# Patient Record
Sex: Male | Born: 1945 | Race: Black or African American | Hispanic: No | Marital: Married | Smoking: Former smoker
Health system: Southern US, Community
[De-identification: ages and names within clinical notes are randomized; demographics above are authoritative.]

## PROBLEM LIST (undated history)

## (undated) DIAGNOSIS — I10 Essential (primary) hypertension: Secondary | ICD-10-CM

## (undated) DIAGNOSIS — E119 Type 2 diabetes mellitus without complications: Secondary | ICD-10-CM

---

## 1998-01-27 ENCOUNTER — Encounter: Admission: RE | Admit: 1998-01-27 | Discharge: 1998-01-27 | Payer: Self-pay | Admitting: Family Medicine

## 1998-02-28 ENCOUNTER — Encounter: Admission: RE | Admit: 1998-02-28 | Discharge: 1998-02-28 | Payer: Self-pay | Admitting: Family Medicine

## 1998-03-10 ENCOUNTER — Encounter: Admission: RE | Admit: 1998-03-10 | Discharge: 1998-03-10 | Payer: Self-pay | Admitting: Family Medicine

## 1998-03-29 ENCOUNTER — Encounter: Admission: RE | Admit: 1998-03-29 | Discharge: 1998-03-29 | Payer: Self-pay | Admitting: Sports Medicine

## 1998-06-10 ENCOUNTER — Emergency Department (HOSPITAL_COMMUNITY): Admission: EM | Admit: 1998-06-10 | Discharge: 1998-06-10 | Payer: Self-pay | Admitting: Emergency Medicine

## 2007-07-10 ENCOUNTER — Emergency Department (HOSPITAL_COMMUNITY): Admission: EM | Admit: 2007-07-10 | Discharge: 2007-07-10 | Payer: Self-pay | Admitting: Emergency Medicine

## 2008-08-15 ENCOUNTER — Emergency Department (HOSPITAL_COMMUNITY): Admission: EM | Admit: 2008-08-15 | Discharge: 2008-08-15 | Payer: Self-pay | Admitting: Family Medicine

## 2010-09-14 LAB — PSA: PSA: 1.75 ng/mL (ref 0.10–4.00)

## 2010-09-14 LAB — POCT URINALYSIS DIP (DEVICE)
Bilirubin Urine: NEGATIVE
Glucose, UA: 500 mg/dL — AB
Hgb urine dipstick: NEGATIVE
Ketones, ur: NEGATIVE mg/dL
Specific Gravity, Urine: 1.02 (ref 1.005–1.030)

## 2010-11-02 IMAGING — CR DG LUMBAR SPINE 2-3V
2 series · 2 of 2 positions shown · non-contrast
Comparison: None.

CLINICAL DATA: Low back pain.  No recent injuries.

LUMBAR SPINE - 2-3 VIEW 08/15/2008:

[view not recorded (1 of 2)]
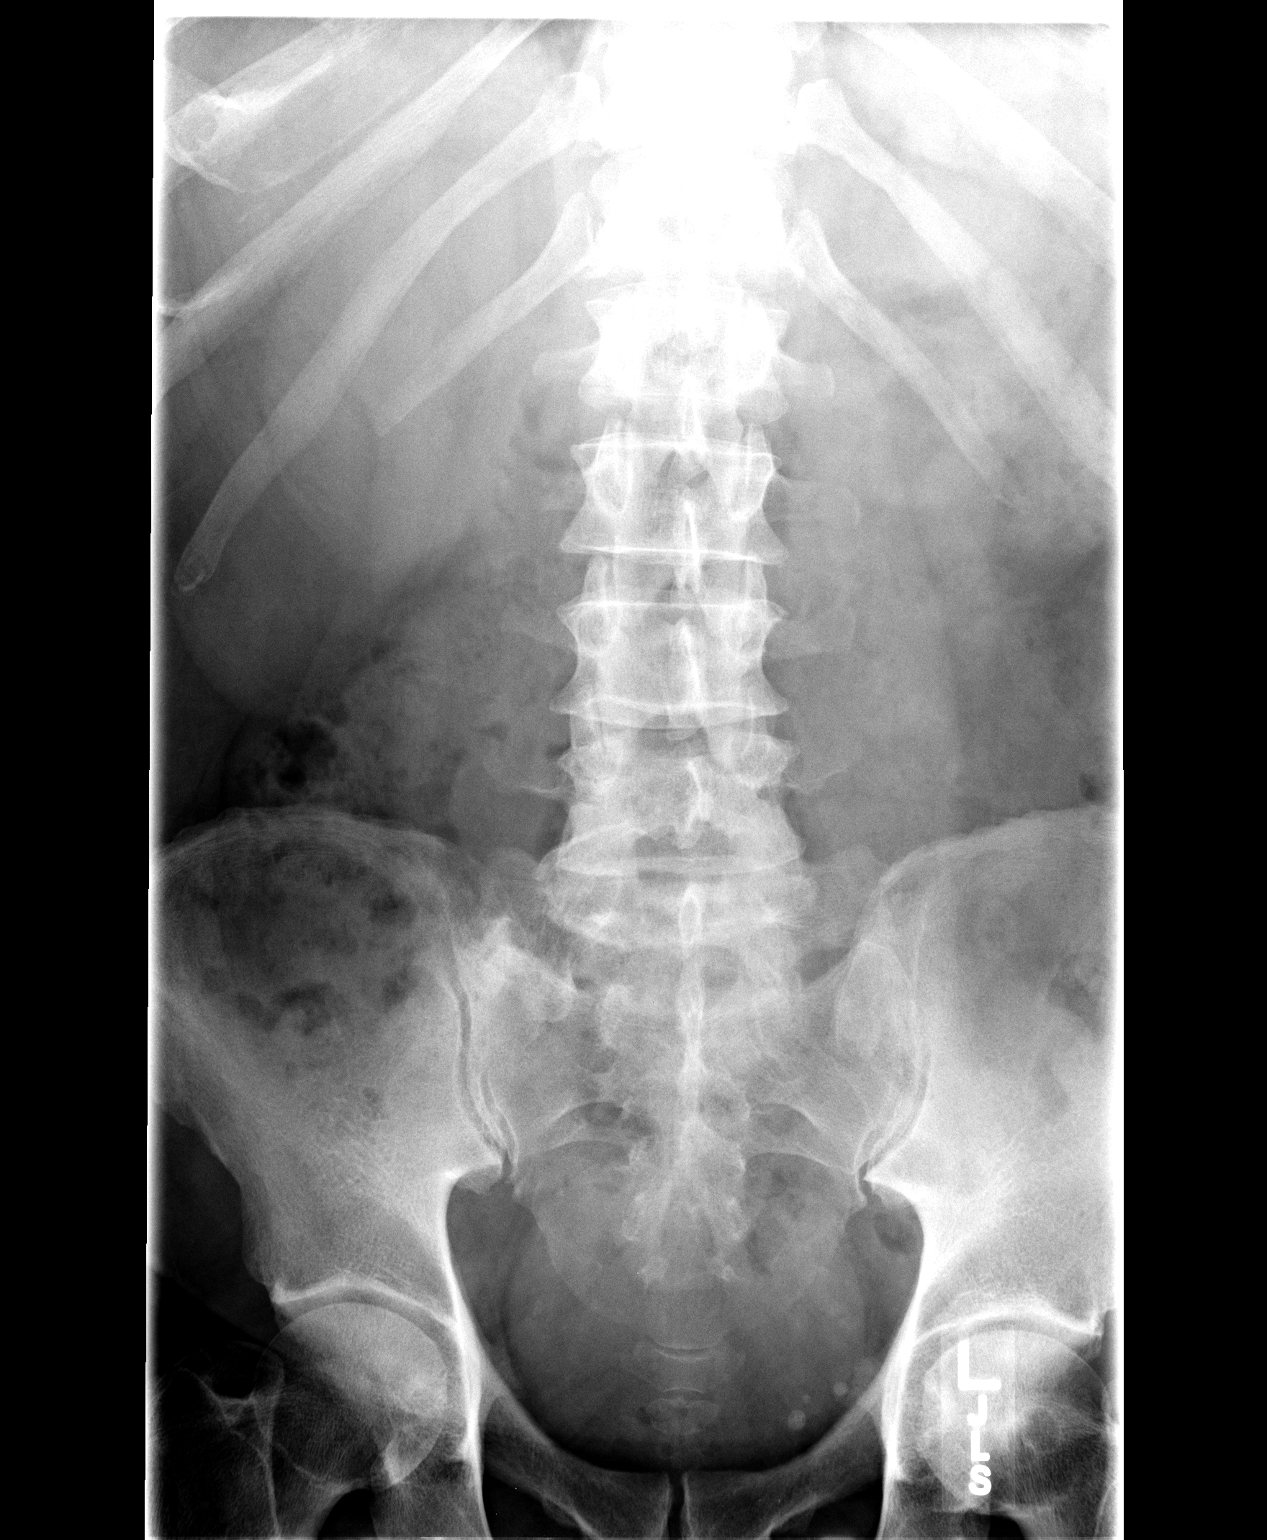

[view not recorded (2 of 2)]
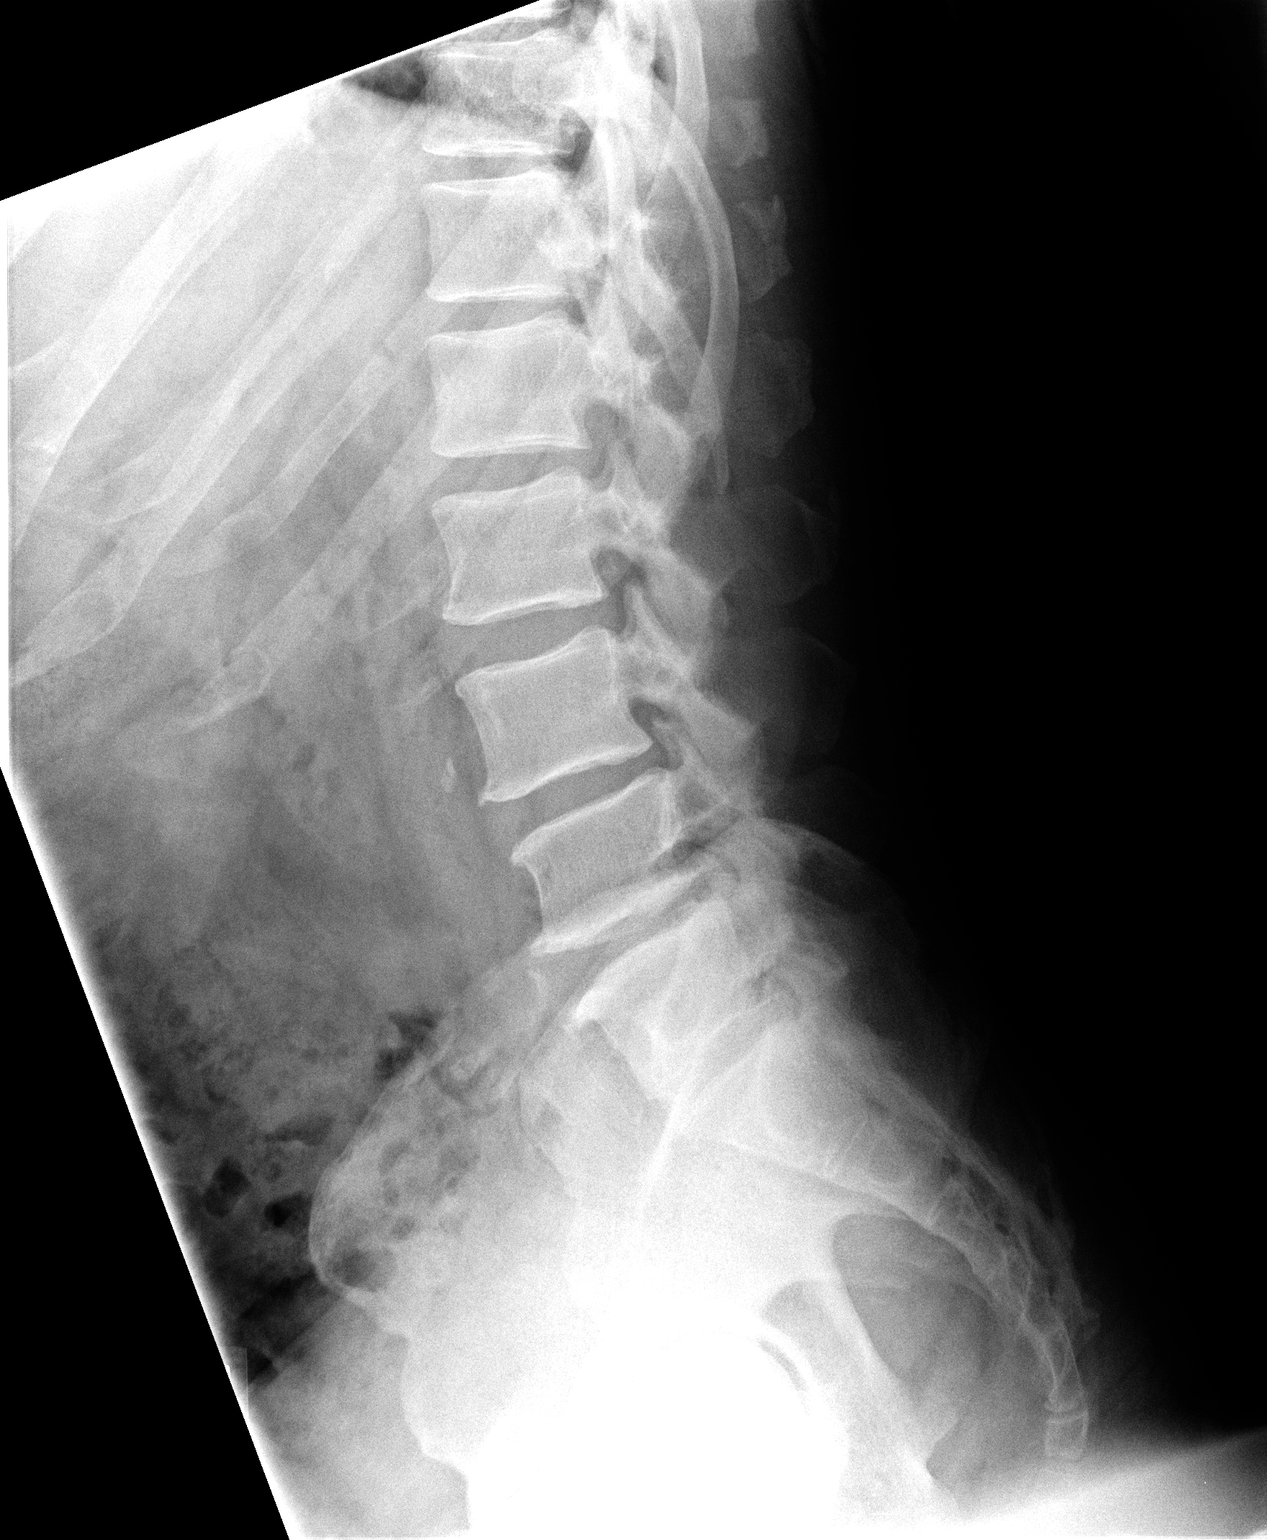

[2 of 2 positions shown; findings below may reference images not displayed]

FINDINGS: Five non-rib bearing lumbar vertebra with anatomic
alignment.  L5 is a transitional lumbosacral segment with well
defined assimilation joints between its transverse processes and
the first sacral segment.  Degenerative changes are present in the
right assimilation joint.  Disc space narrowing and associated
endplate hypertrophic changes at L4-5 and to a lesser degree L3-4
and L5-S1.  Visualized sacroiliac joints intact.
IMPRESSION: 1.  Degenerative disc disease and spondylosis at L4-5 and to a
lesser degree L3-4 and L5-S1.
2.  Transitional L5 segment with assimilation joints between its
transverse processes and the first sacral segment.  Degenerative
changes in the right assimilation joint.

## 2011-02-23 LAB — INFLUENZA A AND B ANTIGEN (CONVERTED LAB)
Inflenza A Ag: NEGATIVE
Influenza B Ag: NEGATIVE

## 2014-07-29 ENCOUNTER — Encounter (HOSPITAL_COMMUNITY): Payer: Self-pay | Admitting: Emergency Medicine

## 2014-07-29 ENCOUNTER — Emergency Department (INDEPENDENT_AMBULATORY_CARE_PROVIDER_SITE_OTHER)
Admission: EM | Admit: 2014-07-29 | Discharge: 2014-07-29 | Disposition: A | Discharge status: Home / Self Care | Payer: PPO | Source: Home / Self Care | Attending: Family Medicine | Admitting: Family Medicine

## 2014-07-29 DIAGNOSIS — I10 Essential (primary) hypertension: Secondary | ICD-10-CM

## 2014-07-29 DIAGNOSIS — J0100 Acute maxillary sinusitis, unspecified: Secondary | ICD-10-CM

## 2014-07-29 HISTORY — DX: Type 2 diabetes mellitus without complications: E11.9

## 2014-07-29 HISTORY — DX: Essential (primary) hypertension: I10

## 2014-07-29 MED ORDER — IPRATROPIUM BROMIDE 0.06 % NA SOLN: 2.0000 | Freq: Four times a day (QID) | NASAL | Status: DC

## 2014-07-29 MED ORDER — AMOXICILLIN-POT CLAVULANATE 875-125 MG PO TABS: 1.0000 | ORAL_TABLET | Freq: Two times a day (BID) | ORAL | Status: DC

## 2014-07-29 NOTE — ED Notes (Signed)
Not in treatment room 

## 2014-07-29 NOTE — ED Provider Notes (Signed)
CSN: [id number]     Arrival date & time 07/29/14  1443 History   First MD Initiated Contact with Patient 07/29/14 1612     Chief Complaint  Patient presents with  . URI   (Consider location/radiation/quality/duration/timing/severity/associated sxs/prior Treatment) HPI  Cold for 3 weeks: runny nose, cough and sore throat. Feeling "warm" over the past week. Getting worse. Now w/ sinus congestion and nausea. OTC cough medicine w/o benefit. Symptoms worse at night. Symptoms are constant  HTN: did not take BP medicine this morning. Pt took cold medicine instead. Denies CP, SOB, palpitations.    Past Medical History  Diagnosis Date  . Hypertension   . Diabetes mellitus without complication    History reviewed. No pertinent past surgical history. Family History  Problem Relation Age of Onset  . Diabetes Mother   . Kidney failure Mother   . Heart attack Father   . Diabetes Maternal Grandmother    History  Substance Use Topics  . Smoking status: Former Games developermoker  . Smokeless tobacco: Not on file  . Alcohol Use: No    Review of Systems Per HPI with all other pertinent systems negative.   Allergies  Review of patient's allergies indicates no known allergies.  Home Medications   Prior to Admission medications   Medication Sig Start Date End Date Taking? Authorizing Provider  METFORMIN HCL PO Take by mouth.   Yes Historical Provider, MD  OVER THE COUNTER MEDICATION Blood pressure pills   Yes Historical Provider, MD  amoxicillin-clavulanate (AUGMENTIN) 875-125 MG per tablet Take 1 tablet by mouth 2 (two) times daily. 07/29/14   Ozella Rocksavid J Merrell, MD  ipratropium (ATROVENT) 0.06 % nasal spray Place 2 sprays into both nostrils 4 (four) times daily. 07/29/14   Ozella Rocksavid J Merrell, MD   BP 165/91 mmHg  Pulse 81  Temp(Src) 98.5 F (36.9 C) (Oral)  Resp 24  SpO2 97% Physical Exam  Constitutional: He is oriented to person, place, and time. He appears well-developed and well-nourished.   HENT:  Head: Normocephalic and atraumatic.  Maxillary sinuses tender to palpation. Pharyngeal cobblestoning tonsils 0-1+ without erythema or exudate  Eyes: EOM are normal. Pupils are equal, round, and reactive to light.  Neck: Normal range of motion. Neck supple.  Cardiovascular: Normal rate and normal heart sounds.   No murmur heard. Pulmonary/Chest: Effort normal and breath sounds normal. No respiratory distress. He has no wheezes. He has no rales. He exhibits no tenderness.  Abdominal: Soft. Bowel sounds are normal.  Musculoskeletal: Normal range of motion. He exhibits no edema or tenderness.  Neurological: He is alert and oriented to person, place, and time.  Skin: Skin is warm and dry.  Psychiatric: He has a normal mood and affect. His behavior is normal. Judgment and thought content normal.    ED Course  Procedures (including critical care time) Labs Review Labs Reviewed - No data to display  Imaging Review No results found.   MDM   1. Acute maxillary sinusitis, recurrence not specified   2. Essential hypertension     Symptoms ongoing for 3 weeks with acute worsening over the last few days. Start Augmentin. Nasal Atrovent for nasal congestion. Start Zyrtec or another over-the-counter allergy medicine. Patient encouraged to take blood pressure medications as prescribed.    Ozella Rocksavid J Merrell, MD 07/29/14 [phone]

## 2014-07-29 NOTE — ED Notes (Signed)
2 week history: cough that is worsening, productive cough, phlegm is yellow.  Reports fever, unknown how high. Denies chills, runny nose, head hurts/tight and chest sore:chest hurts with deep breathing or coughing

## 2014-07-29 NOTE — Discharge Instructions (Signed)
You likely have a sinus infection that will require antibiotics to clear. Please take these for the full 10 days. Please use the nasal atrovent for nasal congestion. Please use tylenol and  ibuprofen for relief.  Please take your blood pressure medication as prescribed.   Please come back if you do not get better.

## 2014-08-10 ENCOUNTER — Emergency Department (INDEPENDENT_AMBULATORY_CARE_PROVIDER_SITE_OTHER)
Admission: EM | Admit: 2014-08-10 | Discharge: 2014-08-10 | Disposition: A | Discharge status: Home / Self Care | Payer: PPO | Source: Home / Self Care | Attending: Family Medicine | Admitting: Family Medicine

## 2014-08-10 ENCOUNTER — Encounter (HOSPITAL_COMMUNITY): Payer: Self-pay | Admitting: Emergency Medicine

## 2014-08-10 DIAGNOSIS — N289 Disorder of kidney and ureter, unspecified: Secondary | ICD-10-CM

## 2014-08-10 DIAGNOSIS — T783XXA Angioneurotic edema, initial encounter: Secondary | ICD-10-CM

## 2014-08-10 DIAGNOSIS — I10 Essential (primary) hypertension: Secondary | ICD-10-CM

## 2014-08-10 LAB — POCT I-STAT, CHEM 8
BUN: 34 mg/dL — AB (ref 6–23)
CHLORIDE: 107 mmol/L (ref 96–112)
CREATININE: 1.5 mg/dL — AB (ref 0.50–1.35)
Calcium, Ion: 1.25 mmol/L (ref 1.13–1.30)
Glucose, Bld: 96 mg/dL (ref 70–99)
HCT: 50 % (ref 39.0–52.0)
Hemoglobin: 17 g/dL (ref 13.0–17.0)
POTASSIUM: 4.7 mmol/L (ref 3.5–5.1)
SODIUM: 141 mmol/L (ref 135–145)
TCO2: 21 mmol/L (ref 0–100)

## 2014-08-10 MED ORDER — METHYLPREDNISOLONE SODIUM SUCC 125 MG IJ SOLR
INTRAMUSCULAR | Status: AC
  Filled 2014-08-10: qty 2

## 2014-08-10 MED ORDER — METHYLPREDNISOLONE SODIUM SUCC 125 MG IJ SOLR
125.0000 mg | Freq: Once | INTRAMUSCULAR | Status: AC
  Administered 2014-08-10: 125 mg via INTRAMUSCULAR

## 2014-08-10 MED ORDER — DIPHENHYDRAMINE HCL 50 MG/ML IJ SOLN
INTRAMUSCULAR | Status: AC
  Filled 2014-08-10: qty 1

## 2014-08-10 MED ORDER — AMLODIPINE BESYLATE 5 MG PO TABS: 5.0000 mg | ORAL_TABLET | Freq: Every day | ORAL | Status: DC

## 2014-08-10 MED ORDER — FAMOTIDINE 20 MG PO TABS
20.0000 mg | ORAL_TABLET | Freq: Once | ORAL | Status: AC
  Administered 2014-08-10: 20 mg via ORAL

## 2014-08-10 MED ORDER — DIPHENHYDRAMINE HCL 50 MG/ML IJ SOLN
50.0000 mg | Freq: Once | INTRAMUSCULAR | Status: AC
  Administered 2014-08-10: 50 mg via INTRAMUSCULAR

## 2014-08-10 MED ORDER — FAMOTIDINE 20 MG PO TABS
ORAL_TABLET | ORAL | Status: AC
  Filled 2014-08-10: qty 1

## 2014-08-10 MED ORDER — PREDNISONE 50 MG PO TABS: ORAL_TABLET | ORAL | Status: DC

## 2014-08-10 NOTE — ED Provider Notes (Signed)
CSN: [id number]     Arrival date & time 08/10/14  1558 History   First MD Initiated Contact with Patient 08/10/14 1651     Chief Complaint  Patient presents with  . Facial Swelling   (Consider location/radiation/quality/duration/timing/severity/associated sxs/prior Treatment) HPI   facial swelling : Started this morning when patient awoke. Involving both orbits , lip( , in left cheek area. Patient denies any tongue swelling or dysphagia her dyspnea. Patient is not itchy and does not have a rash. Patient has not had any change in his medications recently. He takes lisinopril. No change in diet. No other change in home environment that may have caused his symptoms. Patient states that he prayed for significant amount of time  For relief and notes improvement in symptoms.  Symptoms are constant. Nonradiating.   acute sinusitis: diagnosed with acute sinusitis on 07/29/2014. Treated with antibiotics. Patient took the last dose of this yesterday or the day before an issue has completely resolved.    Past Medical History  Diagnosis Date  . Hypertension   . Diabetes mellitus without complication    History reviewed. No pertinent past surgical history. Family History  Problem Relation Age of Onset  . Diabetes Mother   . Kidney failure Mother   . Heart attack Father   . Diabetes Maternal Grandmother    History  Substance Use Topics  . Smoking status: Former Games developermoker  . Smokeless tobacco: Not on file  . Alcohol Use: No    Review of Systems Per HPI with all other pertinent systems negative.   Allergies  Review of patient's allergies indicates no known allergies.  Home Medications   Prior to Admission medications   Medication Sig Start Date End Date Taking? Authorizing Provider  LISINOPRIL PO Take by mouth.   Yes Historical Provider, MD  METFORMIN HCL PO Take by mouth.   Yes Historical Provider, MD  simvastatin (ZOCOR) 10 MG tablet Take 10 mg by mouth daily.   Yes Historical Provider,  MD  ipratropium (ATROVENT) 0.06 % nasal spray Place 2 sprays into both nostrils 4 (four) times daily. 07/29/14   Ozella Rocksavid J Scarlettrose Costilow, MD  OVER THE COUNTER MEDICATION Blood pressure pills    Historical Provider, MD   BP 148/88 mmHg  Pulse 90  Temp(Src) 98.1 F (36.7 C) (Oral)  Resp 16  SpO2 99% Physical Exam  Constitutional: He is oriented to person, place, and time. He appears well-developed and well-nourished.  HENT:   Bilateral periorbital swelling and lower lip edematous approximately 3-4 times normal. Tongue normal in caliber and articulation.  Eyes: EOM are normal. Pupils are equal, round, and reactive to light.  Neck: Normal range of motion.  Cardiovascular: Normal rate, normal heart sounds and intact distal pulses.   No murmur heard. Pulmonary/Chest: Effort normal and breath sounds normal. No stridor. No respiratory distress. He has no wheezes. He has no rales.  Abdominal: Soft. He exhibits no distension.  Lymphadenopathy:    He has no cervical adenopathy.  Neurological: He is alert and oriented to person, place, and time.  Skin: Skin is warm and dry.  Psychiatric: He has a normal mood and affect. His behavior is normal. Judgment and thought content normal.    ED Course  Procedures (including critical care time) Labs Review Labs Reviewed  POCT I-STAT, CHEM 8 - Abnormal; Notable for the following:    BUN 34 (*)    Creatinine, Ser 1.50 (*)    All other components within normal limits    Imaging  Review No results found.   MDM   1. Angioedema, initial encounter   2. Essential hypertension   3. Renal impairment    Chem 8 showing Cr 1.5 (AKI vs CKD - likely CKD). No previous labs to compare Stop lisinopril - Start Norvasc for BP control - f/u PCP in 1-2 weeks for further bp management Solumedrol  IM, Benadryl  IM, pepcid  po given. Pt stable and not needing further mgt in ED at this time Continue Steroids (prednisone ) x 6 days F/u PCP regarding renal  function  Strict precautions given     Ozella Rocks, MD 08/10/14 [phone]

## 2014-08-10 NOTE — Discharge Instructions (Signed)
Your symptoms are likely due to a severe reaction to the lisnopril. Please do not ever take this medicine again Please start norvasc in its place Please take the prednisone and use benadryl for the next 1-2 days Please follow up with your doctor in 1-2 weeks for further blood work and a blood pressure check Your kidney function was not normal and you need this to be checked.

## 2014-08-10 NOTE — ED Notes (Signed)
Reports swelling to lips and left side of face.   Pt states that after eating dinner last night he fell asleep and woke with facial swelling.  No new foods or change in diet.  Currently finished antibiotic RX Amoxicillin.  Denies any other changes or new medications.

## 2015-08-16 DIAGNOSIS — Z961 Presence of intraocular lens: Secondary | ICD-10-CM | POA: Diagnosis not present

## 2015-08-16 DIAGNOSIS — E119 Type 2 diabetes mellitus without complications: Secondary | ICD-10-CM | POA: Diagnosis not present

## 2015-08-16 DIAGNOSIS — I10 Essential (primary) hypertension: Secondary | ICD-10-CM | POA: Diagnosis not present

## 2015-09-07 DIAGNOSIS — I10 Essential (primary) hypertension: Secondary | ICD-10-CM | POA: Diagnosis not present

## 2015-09-07 DIAGNOSIS — G4700 Insomnia, unspecified: Secondary | ICD-10-CM | POA: Diagnosis not present

## 2015-09-07 DIAGNOSIS — E119 Type 2 diabetes mellitus without complications: Secondary | ICD-10-CM | POA: Diagnosis not present

## 2015-09-07 DIAGNOSIS — Z125 Encounter for screening for malignant neoplasm of prostate: Secondary | ICD-10-CM | POA: Diagnosis not present

## 2015-09-19 DIAGNOSIS — Z961 Presence of intraocular lens: Secondary | ICD-10-CM | POA: Diagnosis not present

## 2015-09-19 DIAGNOSIS — E119 Type 2 diabetes mellitus without complications: Secondary | ICD-10-CM | POA: Diagnosis not present

## 2016-08-14 DIAGNOSIS — E119 Type 2 diabetes mellitus without complications: Secondary | ICD-10-CM | POA: Diagnosis not present

## 2016-08-14 DIAGNOSIS — I10 Essential (primary) hypertension: Secondary | ICD-10-CM | POA: Diagnosis not present

## 2016-09-19 DIAGNOSIS — E119 Type 2 diabetes mellitus without complications: Secondary | ICD-10-CM | POA: Diagnosis not present

## 2016-09-19 DIAGNOSIS — H26492 Other secondary cataract, left eye: Secondary | ICD-10-CM | POA: Diagnosis not present

## 2016-09-19 DIAGNOSIS — H26491 Other secondary cataract, right eye: Secondary | ICD-10-CM | POA: Diagnosis not present

## 2018-03-24 DIAGNOSIS — H26493 Other secondary cataract, bilateral: Secondary | ICD-10-CM | POA: Diagnosis not present

## 2018-03-24 DIAGNOSIS — Z961 Presence of intraocular lens: Secondary | ICD-10-CM | POA: Diagnosis not present

## 2018-03-24 DIAGNOSIS — H26492 Other secondary cataract, left eye: Secondary | ICD-10-CM | POA: Diagnosis not present

## 2018-03-24 DIAGNOSIS — H18411 Arcus senilis, right eye: Secondary | ICD-10-CM | POA: Diagnosis not present

## 2018-03-24 DIAGNOSIS — E119 Type 2 diabetes mellitus without complications: Secondary | ICD-10-CM | POA: Diagnosis not present

## 2020-05-04 ENCOUNTER — Ambulatory Visit (HOSPITAL_COMMUNITY)
Admission: EM | Admit: 2020-05-04 | Discharge: 2020-05-04 | Disposition: A | Discharge status: Home / Self Care | Payer: Medicare Other | Attending: Family Medicine | Admitting: Family Medicine

## 2020-05-04 ENCOUNTER — Encounter (HOSPITAL_COMMUNITY): Payer: Self-pay

## 2020-05-04 ENCOUNTER — Other Ambulatory Visit: Payer: Self-pay

## 2020-05-04 ENCOUNTER — Ambulatory Visit (INDEPENDENT_AMBULATORY_CARE_PROVIDER_SITE_OTHER): Payer: Medicare Other

## 2020-05-04 DIAGNOSIS — J069 Acute upper respiratory infection, unspecified: Secondary | ICD-10-CM | POA: Insufficient documentation

## 2020-05-04 DIAGNOSIS — Z7952 Long term (current) use of systemic steroids: Secondary | ICD-10-CM | POA: Diagnosis not present

## 2020-05-04 DIAGNOSIS — R0602 Shortness of breath: Secondary | ICD-10-CM

## 2020-05-04 DIAGNOSIS — R059 Cough, unspecified: Secondary | ICD-10-CM | POA: Diagnosis not present

## 2020-05-04 DIAGNOSIS — Z87891 Personal history of nicotine dependence: Secondary | ICD-10-CM | POA: Diagnosis not present

## 2020-05-04 DIAGNOSIS — Z20822 Contact with and (suspected) exposure to covid-19: Secondary | ICD-10-CM | POA: Diagnosis not present

## 2020-05-04 LAB — RESP PANEL BY RT-PCR (FLU A&B, COVID) ARPGX2
Influenza A by PCR: NEGATIVE
Influenza B by PCR: NEGATIVE
SARS Coronavirus 2 by RT PCR: NEGATIVE

## 2020-05-04 MED ORDER — AZITHROMYCIN 250 MG PO TABS
ORAL_TABLET | ORAL | 0 refills | Status: AC
Start: 2020-05-04 — End: 2020-05-09

## 2020-05-04 NOTE — ED Provider Notes (Signed)
[facility]    CSN: [id number] Arrival date & time: 05/04/20  1311      History   Chief Complaint Chief Complaint  Patient presents with  . Cough  . Shortness of Breath  . Generalized Body Aches  . Fatigue    HPI Darrell Todd is a 74 y.o. male.   Darrell Todd presents with complaints of cough and shortness of breath. First noted feeling unwell 11/28, but worse on 11/29. Has felt like he could lose consciousness but has not. No known fevers or chills. Diarrhea. No nausea or vomiting. Nasal drainage. No sore throat or ear pain. No known ill contacts. Has had two covid-19 vaccinations, has not received a booster yet. Has been taking over the counter medications which have minimally helped. No history of copd or asthma. Quit smoking 30 years ago. History of htn and DM.    ROS per HPI, negative if not otherwise mentioned.      Past Medical History:  Diagnosis Date  . Diabetes mellitus without complication (HCC)   . Hypertension     There are no problems to display for this patient.   History reviewed. No pertinent surgical history.     Home Medications    Prior to Admission medications   Medication Sig Start Date End Date Taking? Authorizing Provider  amLODipine (NORVASC) 5 MG tablet Take 1 tablet (5 mg total) by mouth daily. 08/10/14   Ozella Rocks, MD  azithromycin (ZITHROMAX) 250 MG tablet Take 2 tablets (500 mg total) by mouth daily for 1 day, THEN 1 tablet (250 mg total) daily for 4 days. 05/04/20 05/09/20  Linus Mako B, NP  ipratropium (ATROVENT) 0.06 % nasal spray Place 2 sprays into both nostrils 4 (four) times daily. 07/29/14   Ozella Rocks, MD  METFORMIN HCL PO Take by mouth.    [provider]  OVER THE COUNTER MEDICATION Blood pressure pills    [provider]  predniSONE (DELTASONE) 50 MG tablet Take daily with breakfast 08/10/14   Ozella Rocks, MD  simvastatin (ZOCOR) 10 MG tablet Take 10 mg by mouth daily.     [provider]  LISINOPRIL PO Take by mouth.  08/10/14  [provider]    Family History Family History  Problem Relation Age of Onset  . Diabetes Mother   . Kidney failure Mother   . Heart attack Father   . Diabetes Maternal Grandmother     Social History Social History   Tobacco Use  . Smoking status: Former Games developer  . Smokeless tobacco: Never Used  Substance Use Topics  . Alcohol use: No  . Drug use: No     Allergies   Lisinopril   Review of Systems Review of Systems   Physical Exam Triage Vital Signs ED Triage Vitals  Enc Vitals Group     BP 05/04/20 1346 (!) 148/64     Pulse Rate 05/04/20 1346 86     Resp 05/04/20 1346 (!) 21     Temp 05/04/20 1346 97.9 F (36.6 C)     Temp Source 05/04/20 1345 Oral     SpO2 05/04/20 1346 99 %     Weight --      Height --      Head Circumference --      Peak Flow --      Pain Score 05/04/20 1343 4     Pain Loc --      Pain Edu? --  Excl. in GC? --    No data found.  Updated Vital Signs BP (!) 148/64 (BP Location: Right Arm)   Pulse 86   Temp 97.9 F (36.6 C) (Oral)   Resp (!) 21   SpO2 99%    Physical Exam Constitutional:      Appearance: He is well-developed.  HENT:     Mouth/Throat:     Mouth: Mucous membranes are moist.  Cardiovascular:     Rate and Rhythm: Normal rate.  Pulmonary:     Effort: Pulmonary effort is normal.     Breath sounds: Decreased breath sounds present.     Comments: Occasional cough noted  Skin:    General: Skin is warm and dry.  Neurological:     Mental Status: He is alert and oriented to person, place, and time.      UC Treatments / Results  Labs (all labs ordered are listed, but only abnormal results are displayed) Labs Reviewed  RESP PANEL BY RT-PCR (FLU A&B, COVID) [id number]    EKG   Radiology DG Chest 2 View  Result Date: 05/04/2020 CLINICAL DATA:  74 year old male with shortness of breath and cough. EXAM: CHEST - 2 VIEW COMPARISON:   None. FINDINGS: No focal consolidation, pleural effusion or pneumothorax. The cardiac silhouette is within limits. No acute osseous pathology. Indeterminate 12 mm nodular density in the soft tissues of the left lateral chest wall. IMPRESSION: No active cardiopulmonary disease. Electronically Signed   By: Elgie Collard M.D.   On: 05/04/2020 15:56    Procedures Procedures (including critical care time)  Medications Ordered in UC Medications - No data to display  Initial Impression / Assessment and Plan / UC Course  I have reviewed the triage vital signs and the nursing notes.  Pertinent labs & imaging results that were available during my care of the patient were reviewed by me and considered in my medical decision making (see chart for details).     Chest xray normal today which is reassuring. Mild tachypnea and description of significant shortness of breath over the past few day. Afebrile here today. Opted to provide empiric treatment with azithromycin at this time pending viral panel. Return precautions provided. Patient verbalized understanding and agreeable to plan.  Ambulatory out of clinic without difficulty.    Final Clinical Impressions(s) / UC Diagnoses   Final diagnoses:  Upper respiratory tract infection, unspecified type     Discharge Instructions     Your chest xray looks well today which is reassuring.  I have sent antibiotics I would like you to take in hopes that this is helpful.  If this is viral, antibiotics may not be beneficial, however.  Self isolate until covid results are back and negative.  Will notify you by phone of any positive findings. Your negative results will be sent through your MyChart.     Please return if symptoms persist. Please go to the ER if symptoms are worsening.      ED Prescriptions    Medication Sig Dispense Auth. Provider   azithromycin (ZITHROMAX) 250 MG tablet Take 2 tablets (500 mg total) by mouth daily for 1 day, THEN 1 tablet  (250 mg total) daily for 4 days. 6 tablet Georgetta Haber, NP     PDMP not reviewed this encounter.   Georgetta Haber, NP 05/04/20 [phone]

## 2020-05-04 NOTE — ED Triage Notes (Addendum)
Pt in with c/o body aches, productive cough, fatigue and SOB that started Sunday.also c/o feeling light headed  Pt states he has tired to take cold medicine but has had no relief  Denies fever, N/V, diarrhea

## 2020-05-04 NOTE — Discharge Instructions (Addendum)
Your chest xray looks well today which is reassuring.  I have sent antibiotics I would like you to take in hopes that this is helpful.  If this is viral, antibiotics may not be beneficial, however.  Self isolate until covid results are back and negative.  Will notify you by phone of any positive findings. Your negative results will be sent through your MyChart.     Please return if symptoms persist. Please go to the ER if symptoms are worsening.

## 2020-06-04 ENCOUNTER — Emergency Department (HOSPITAL_COMMUNITY)
Admission: EM | Admit: 2020-06-04 | Discharge: 2020-06-04 | Disposition: A | Discharge status: Home / Self Care | Payer: Medicare Other | Attending: Emergency Medicine | Admitting: Emergency Medicine

## 2020-06-04 ENCOUNTER — Other Ambulatory Visit: Payer: Self-pay

## 2020-06-04 ENCOUNTER — Encounter (HOSPITAL_COMMUNITY): Payer: Self-pay | Admitting: Emergency Medicine

## 2020-06-04 DIAGNOSIS — I10 Essential (primary) hypertension: Secondary | ICD-10-CM | POA: Diagnosis not present

## 2020-06-04 DIAGNOSIS — Z79899 Other long term (current) drug therapy: Secondary | ICD-10-CM | POA: Insufficient documentation

## 2020-06-04 DIAGNOSIS — Z7984 Long term (current) use of oral hypoglycemic drugs: Secondary | ICD-10-CM | POA: Insufficient documentation

## 2020-06-04 DIAGNOSIS — Z87891 Personal history of nicotine dependence: Secondary | ICD-10-CM | POA: Diagnosis not present

## 2020-06-04 DIAGNOSIS — R739 Hyperglycemia, unspecified: Secondary | ICD-10-CM | POA: Diagnosis present

## 2020-06-04 DIAGNOSIS — E1165 Type 2 diabetes mellitus with hyperglycemia: Secondary | ICD-10-CM | POA: Insufficient documentation

## 2020-06-04 DIAGNOSIS — Z7952 Long term (current) use of systemic steroids: Secondary | ICD-10-CM | POA: Insufficient documentation

## 2020-06-04 LAB — BASIC METABOLIC PANEL
Anion gap: 7 (ref 5–15)
BUN: 21 mg/dL (ref 8–23)
CO2: 28 mmol/L (ref 22–32)
Calcium: 9.5 mg/dL (ref 8.9–10.3)
Chloride: 98 mmol/L (ref 98–111)
Creatinine, Ser: 1.34 mg/dL — ABNORMAL HIGH (ref 0.61–1.24)
GFR, Estimated: 55 mL/min — ABNORMAL LOW (ref >60)
Glucose, Bld: 403 mg/dL — ABNORMAL HIGH (ref 70–99)
Potassium: 4.4 mmol/L (ref 3.5–5.1)
Sodium: 133 mmol/L — ABNORMAL LOW (ref 135–145)

## 2020-06-04 LAB — URINALYSIS, ROUTINE W REFLEX MICROSCOPIC
Bacteria, UA: NONE SEEN
Bilirubin Urine: NEGATIVE
Glucose, UA: >=500 mg/dL — AB
Hgb urine dipstick: NEGATIVE
Ketones, ur: NEGATIVE mg/dL
Leukocytes,Ua: NEGATIVE
Nitrite: NEGATIVE
Protein, ur: NEGATIVE mg/dL
Specific Gravity, Urine: 1.02 (ref 1.005–1.030)
pH: 5 (ref 5.0–8.0)

## 2020-06-04 LAB — CBC
HCT: 39.9 % (ref 39.0–52.0)
Hemoglobin: 12.7 g/dL — ABNORMAL LOW (ref 13.0–17.0)
MCH: 29.1 pg (ref 26.0–34.0)
MCHC: 31.8 g/dL (ref 30.0–36.0)
MCV: 91.3 fL (ref 80.0–100.0)
Platelets: 203 10*3/uL (ref 150–400)
RBC: 4.37 MIL/uL (ref 4.22–5.81)
RDW: 11.9 % (ref 11.5–15.5)
WBC: 3.8 10*3/uL — ABNORMAL LOW (ref 4.0–10.5)
nRBC: 0 % (ref 0.0–0.2)

## 2020-06-04 LAB — CBG MONITORING, ED
Glucose-Capillary: 392 mg/dL — ABNORMAL HIGH (ref 70–99)
Glucose-Capillary: 274 mg/dL — ABNORMAL HIGH (ref 70–99)

## 2020-06-04 MED ORDER — METFORMIN HCL 1000 MG PO TABS: 1000.0000 mg | ORAL_TABLET | Freq: Two times a day (BID) | ORAL | 0 refills | Status: AC

## 2020-06-04 MED ORDER — INSULIN ASPART 100 UNIT/ML ~~LOC~~ SOLN
8.0000 [IU] | Freq: Once | SUBCUTANEOUS | Status: AC
  Administered 2020-06-04: 8 [IU] via SUBCUTANEOUS

## 2020-06-04 MED ORDER — INSULIN ASPART 100 UNIT/ML ~~LOC~~ SOLN
12.0000 [IU] | Freq: Once | SUBCUTANEOUS | Status: DC
Start: 2020-06-04 — End: 2020-06-04

## 2020-06-04 NOTE — ED Notes (Signed)
Pt fed per provider. 

## 2020-06-04 NOTE — ED Provider Notes (Signed)
[facility] EMERGENCY DEPARTMENT Provider Note   CSN: [id number] Arrival date & time: 06/04/20  1513     History Chief Complaint  Patient presents with  . Hyperglycemia    Darrell Todd is a 75 y.o. male.  Patient presents ER chief concern of diabetes.  He states that he is diabetic but has been off medicines for about 2 years.  He was feeling dehydrated and fatigued and checked his blood sugar with his family members glucometer and it stated it was 400.  Brought to the ER for evaluation.  Denies headache or chest pain or fevers or vomiting or cough or diarrhea.  No abdominal pain.  States he used to be on a pill he does not recall the name of.        Past Medical History:  Diagnosis Date  . Diabetes mellitus without complication (HCC)   . Hypertension     There are no problems to display for this patient.   History reviewed. No pertinent surgical history.     Family History  Problem Relation Age of Onset  . Diabetes Mother   . Kidney failure Mother   . Heart attack Father   . Diabetes Maternal Grandmother     Social History   Tobacco Use  . Smoking status: Former Games developer  . Smokeless tobacco: Never Used  Substance Use Topics  . Alcohol use: No  . Drug use: No    Home Medications Prior to Admission medications   Medication Sig Start Date End Date Taking? Authorizing Provider  metFORMIN (GLUCOPHAGE) 1000 MG tablet Take 1 tablet (1,000 mg total) by mouth 2 (two) times daily. 06/04/20  Yes Cheryll Cockayne, MD  amLODipine (NORVASC) 5 MG tablet Take 1 tablet (5 mg total) by mouth daily. 08/10/14   Ozella Rocks, MD  ipratropium (ATROVENT) 0.06 % nasal spray Place 2 sprays into both nostrils 4 (four) times daily. 07/29/14   Ozella Rocks, MD  OVER THE COUNTER MEDICATION Blood pressure pills    [provider]  predniSONE (DELTASONE) 50 MG tablet Take daily with breakfast 08/10/14   Ozella Rocks, MD  simvastatin (ZOCOR) 10 MG tablet  Take 10 mg by mouth daily.    [provider]  LISINOPRIL PO Take by mouth.  08/10/14  [provider]    Allergies    Lisinopril  Review of Systems   Review of Systems  Constitutional: Negative for fever.  HENT: Negative for ear pain and sore throat.   Eyes: Negative for pain.  Respiratory: Negative for cough.   Cardiovascular: Negative for chest pain.  Gastrointestinal: Negative for abdominal pain.  Genitourinary: Negative for flank pain.  Musculoskeletal: Negative for back pain.  Skin: Negative for color change and rash.  Neurological: Negative for syncope.  All other systems reviewed and are negative.   Physical Exam Updated Vital Signs BP (!) 145/74 (BP Location: Right Arm)   Pulse 66   Temp 98.2 F (36.8 C) (Oral)   Resp 18   SpO2 100%   Physical Exam Constitutional:      General: He is not in acute distress.    Appearance: He is well-developed.  HENT:     Head: Normocephalic.     Nose: Nose normal.  Eyes:     Extraocular Movements: Extraocular movements intact.  Cardiovascular:     Rate and Rhythm: Normal rate.  Pulmonary:     Effort: Pulmonary effort is normal.  Skin:    Coloration: Skin  is not jaundiced.  Neurological:     Mental Status: He is alert. Mental status is at baseline.     ED Results / Procedures / Treatments   Labs (all labs ordered are listed, but only abnormal results are displayed) Labs Reviewed  BASIC METABOLIC PANEL - Abnormal; Notable for the following components:      Result Value   Sodium 133 (*)    Glucose, Bld 403 (*)    Creatinine, Ser 1.34 (*)    GFR, Estimated 55 (*)    All other components within normal limits  CBC - Abnormal; Notable for the following components:   WBC 3.8 (*)    Hemoglobin 12.7 (*)    All other components within normal limits  URINALYSIS, ROUTINE W REFLEX MICROSCOPIC - Abnormal; Notable for the following components:   Glucose, UA >=500 (*)    All other components within normal  limits  CBG MONITORING, ED - Abnormal; Notable for the following components:   Glucose-Capillary 392 (*)    All other components within normal limits  CBG MONITORING, ED - Abnormal; Notable for the following components:   Glucose-Capillary 274 (*)    All other components within normal limits  CBG MONITORING, ED    EKG None  Radiology No results found.  Procedures Procedures (including critical care time)  Medications Ordered in ED Medications  insulin aspart (novoLOG) injection 8 Units (8 Units Subcutaneous Given 06/04/20 2046)    ED Course  I have reviewed the triage vital signs and the nursing notes.  Pertinent labs & imaging results that were available during my care of the patient were reviewed by me and considered in my medical decision making (see chart for details).    MDM Rules/Calculators/A&P                          Labs show glucose of 403.  White count of 3.  Patient given insulin subcu 12 units, her subsequent blood sugars in the 200 range.  We will start the patient on.  Advising follow-up with his doctor with whom he has an appointment in 5 days.  Advised return if his blood sugars continue to be high if he has fevers worsening fatigue trouble breathing or any additional concerns return immediately back to the ER.  Final Clinical Impression(s) / ED Diagnoses Final diagnoses:  Hyperglycemia    Rx / DC Orders ED Discharge Orders         Ordered    metFORMIN (GLUCOPHAGE) 1000 MG tablet  2 times daily        06/04/20 2152           Cheryll Cockayne, MD 06/04/20 2152

## 2020-06-04 NOTE — ED Triage Notes (Signed)
Pt reports he checked his blood sugar with his brother's glucometer, and it was in the 400s. Pt states he has a hx of diabetes, but is not currently taking medications for it. C/o generalized fatigue and excessive thirst.

## 2020-06-04 NOTE — Discharge Instructions (Addendum)
Call your primary care doctor or specialist as discussed in the next 2-3 days.   Return immediately back to the ER if:  Your symptoms worsen within the next 12-24 hours. You develop new symptoms such as new fevers, persistent vomiting, new pain, shortness of breath, or new weakness or numbness, or if you have any other concerns.  

## 2020-06-04 NOTE — ED Notes (Signed)
Updated on wait for treatment room and moved to another location in lobby away from other patients per family members request.

## 2020-12-15 ENCOUNTER — Other Ambulatory Visit (HOSPITAL_COMMUNITY): Payer: Self-pay | Admitting: Internal Medicine

## 2020-12-15 DIAGNOSIS — I739 Peripheral vascular disease, unspecified: Secondary | ICD-10-CM

## 2020-12-19 ENCOUNTER — Inpatient Hospital Stay (HOSPITAL_COMMUNITY): Admission: RE | Admit: 2020-12-19 | Payer: Medicare Other | Source: Ambulatory Visit

## 2020-12-20 ENCOUNTER — Ambulatory Visit (HOSPITAL_COMMUNITY): Admission: RE | Admit: 2020-12-20 | Payer: Medicare Other | Source: Ambulatory Visit

## 2020-12-21 ENCOUNTER — Ambulatory Visit (HOSPITAL_COMMUNITY)
Admission: RE | Admit: 2020-12-21 | Discharge: 2020-12-21 | Disposition: A | Discharge status: Home / Self Care | Payer: Medicare Other | Source: Ambulatory Visit | Attending: Internal Medicine | Admitting: Internal Medicine

## 2020-12-21 ENCOUNTER — Other Ambulatory Visit: Payer: Self-pay

## 2020-12-21 DIAGNOSIS — I739 Peripheral vascular disease, unspecified: Secondary | ICD-10-CM | POA: Insufficient documentation

## 2021-06-13 ENCOUNTER — Other Ambulatory Visit: Payer: Self-pay | Admitting: Internal Medicine

## 2021-06-14 LAB — CBC
HCT: 38.7 % (ref 38.5–50.0)
Hemoglobin: 12.7 g/dL — ABNORMAL LOW (ref 13.2–17.1)
MCH: 29.8 pg (ref 27.0–33.0)
MCHC: 32.8 g/dL (ref 32.0–36.0)
MCV: 90.8 fL (ref 80.0–100.0)
MPV: 8.9 fL (ref 7.5–12.5)
Platelets: 246 10*3/uL (ref 140–400)
RBC: 4.26 10*6/uL (ref 4.20–5.80)
RDW: 12.6 % (ref 11.0–15.0)
WBC: 4 10*3/uL (ref 3.8–10.8)

## 2021-06-14 LAB — COMPLETE METABOLIC PANEL WITH GFR
AG Ratio: 1.3 (calc) (ref 1.0–2.5)
ALT: 15 U/L (ref 9–46)
AST: 17 U/L (ref 10–35)
Albumin: 3.9 g/dL (ref 3.6–5.1)
Alkaline phosphatase (APISO): 52 U/L (ref 35–144)
BUN/Creatinine Ratio: 17 (calc) (ref 6–22)
BUN: 23 mg/dL (ref 7–25)
CO2: 26 mmol/L (ref 20–32)
Calcium: 8.8 mg/dL (ref 8.6–10.3)
Chloride: 104 mmol/L (ref 98–110)
Creat: 1.38 mg/dL — ABNORMAL HIGH (ref 0.70–1.28)
Globulin: 3.1 g/dL (calc) (ref 1.9–3.7)
Glucose, Bld: 109 mg/dL — ABNORMAL HIGH (ref 65–99)
Potassium: 4.7 mmol/L (ref 3.5–5.3)
Sodium: 138 mmol/L (ref 135–146)
Total Bilirubin: 0.3 mg/dL (ref 0.2–1.2)
Total Protein: 7 g/dL (ref 6.1–8.1)
eGFR: 53 mL/min/{1.73_m2} — ABNORMAL LOW (ref >=60)

## 2021-06-14 LAB — LIPID PANEL
Cholesterol: 211 mg/dL — ABNORMAL HIGH (ref <200)
HDL: 51 mg/dL (ref >=40)
LDL Cholesterol (Calc): 138 mg/dL (calc) — ABNORMAL HIGH
Non-HDL Cholesterol (Calc): 160 mg/dL (calc) — ABNORMAL HIGH (ref <130)
Total CHOL/HDL Ratio: 4.1 (calc) (ref <5.0)
Triglycerides: 103 mg/dL (ref <150)

## 2021-06-14 LAB — TSH: TSH: 1.61 mIU/L (ref 0.40–4.50)

## 2022-07-22 IMAGING — DX DG CHEST 2V
2 series · 2 of 2 positions shown · non-contrast
Comparison: None.

CLINICAL DATA: 74-year-old male with shortness of breath and cough.

EXAM:
CHEST - 2 VIEW

[chest pa]
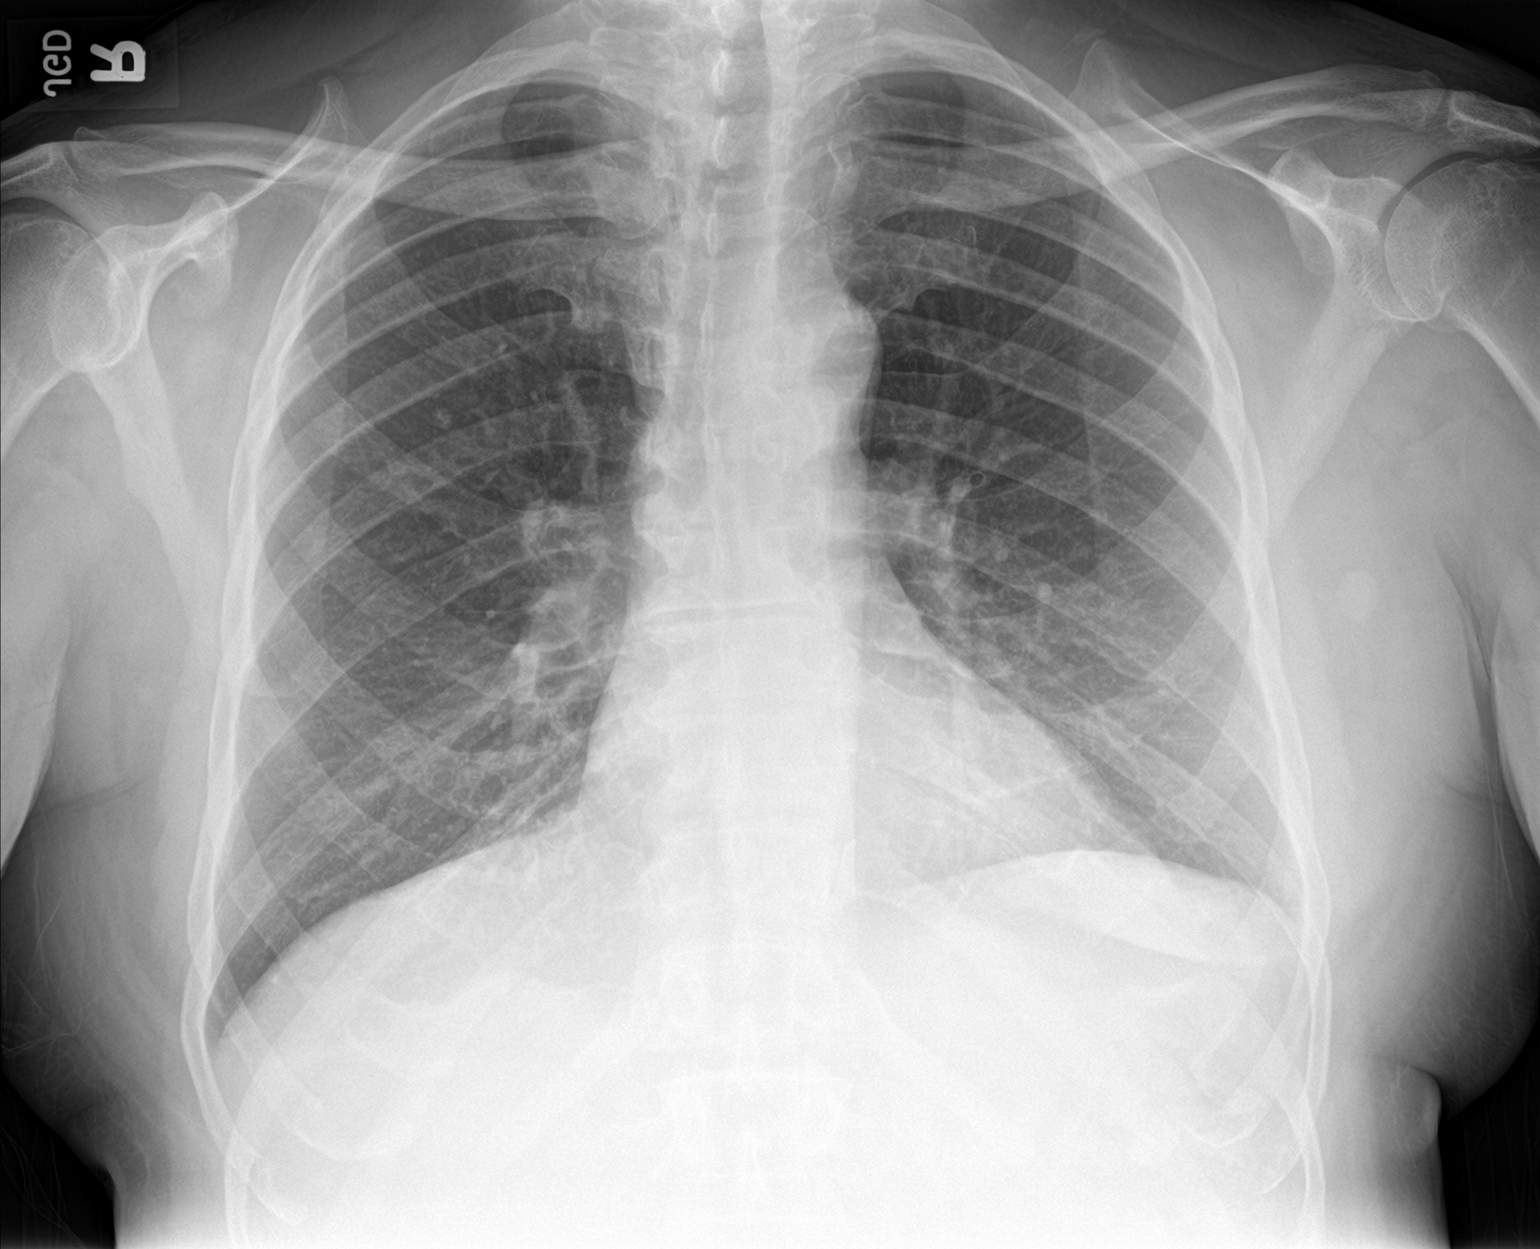

[chest lat]
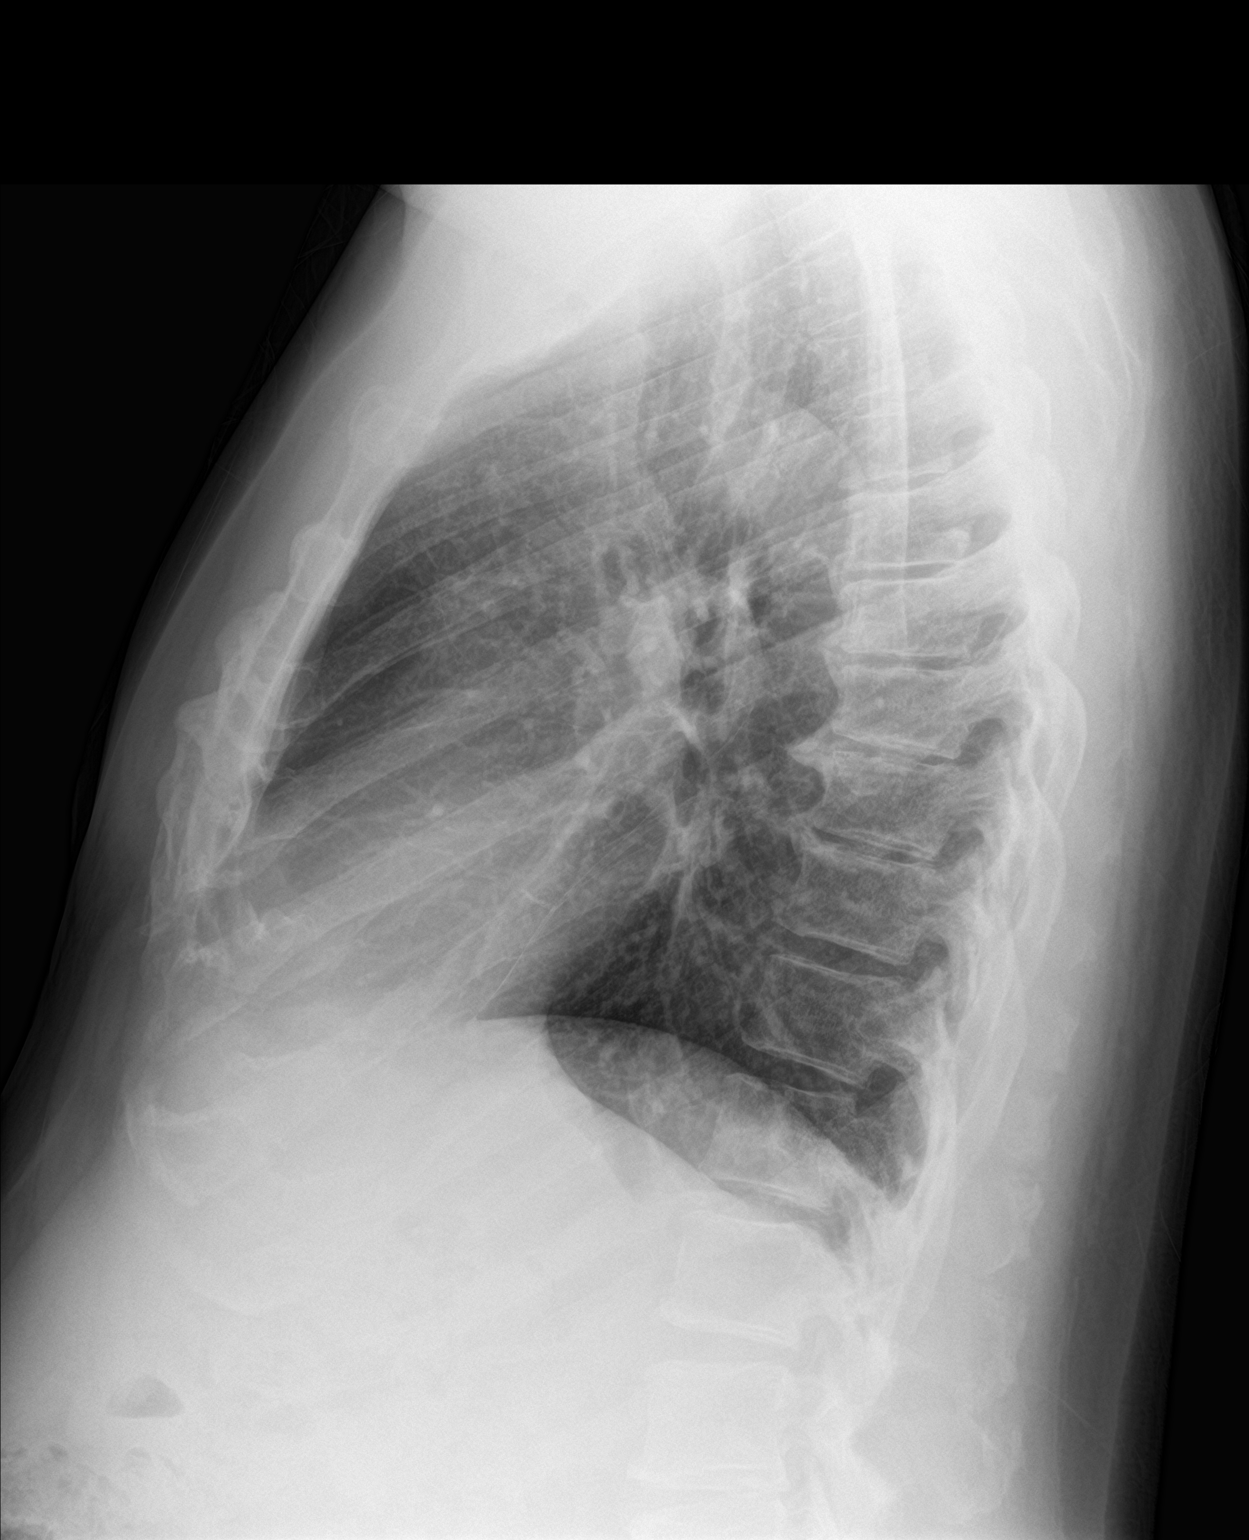

[2 of 2 positions shown; findings below may reference images not displayed]

FINDINGS: No focal consolidation, pleural effusion or pneumothorax. The
cardiac silhouette is within limits. No acute osseous pathology.
Indeterminate 12 mm nodular density in the soft tissues of the left
lateral chest wall.
IMPRESSION: No active cardiopulmonary disease.

## 2022-12-11 ENCOUNTER — Other Ambulatory Visit: Payer: Self-pay | Admitting: Internal Medicine

## 2022-12-12 LAB — BASIC METABOLIC PANEL WITH GFR
BUN/Creatinine Ratio: 17 (calc) (ref 6–22)
BUN: 24 mg/dL (ref 7–25)
CO2: 27 mmol/L (ref 20–32)
Calcium: 9.4 mg/dL (ref 8.6–10.3)
Chloride: 104 mmol/L (ref 98–110)
Creat: 1.38 mg/dL — ABNORMAL HIGH (ref 0.70–1.28)
Glucose, Bld: 107 mg/dL — ABNORMAL HIGH (ref 65–99)
Potassium: 4.5 mmol/L (ref 3.5–5.3)
Sodium: 139 mmol/L (ref 135–146)
eGFR: 53 mL/min/{1.73_m2} — ABNORMAL LOW (ref >=60)

## 2022-12-12 LAB — CBC
HCT: 38.6 % (ref 38.5–50.0)
Hemoglobin: 12.9 g/dL — ABNORMAL LOW (ref 13.2–17.1)
MCH: 30.1 pg (ref 27.0–33.0)
MCHC: 33.4 g/dL (ref 32.0–36.0)
MCV: 90.2 fL (ref 80.0–100.0)
MPV: 9 fL (ref 7.5–12.5)
Platelets: 234 10*3/uL (ref 140–400)
RBC: 4.28 10*6/uL (ref 4.20–5.80)
RDW: 12.7 % (ref 11.0–15.0)
WBC: 3.9 10*3/uL (ref 3.8–10.8)

## 2023-04-13 ENCOUNTER — Ambulatory Visit (HOSPITAL_COMMUNITY)
Admission: EM | Admit: 2023-04-13 | Discharge: 2023-04-13 | Disposition: A | Discharge status: Home / Self Care | Payer: Medicare Other

## 2023-04-13 ENCOUNTER — Encounter (HOSPITAL_COMMUNITY): Payer: Self-pay

## 2023-04-13 DIAGNOSIS — J209 Acute bronchitis, unspecified: Secondary | ICD-10-CM

## 2023-04-13 MED ORDER — PREDNISONE 20 MG PO TABS
20.0000 mg | ORAL_TABLET | Freq: Every day | ORAL | 0 refills | Status: AC
Start: 2023-04-13 — End: ?

## 2023-04-13 MED ORDER — AZITHROMYCIN 250 MG PO TABS: ORAL_TABLET | ORAL | 0 refills | Status: AC

## 2023-04-13 MED ORDER — ALBUTEROL SULFATE HFA 108 (90 BASE) MCG/ACT IN AERS: 1.0000 | INHALATION_SPRAY | Freq: Four times a day (QID) | RESPIRATORY_TRACT | 0 refills | Status: AC | PRN

## 2023-04-13 MED ORDER — BENZONATATE 100 MG PO CAPS
200.0000 mg | ORAL_CAPSULE | Freq: Three times a day (TID) | ORAL | 1 refills | Status: AC
Start: 2023-04-13 — End: ?

## 2023-04-13 MED ORDER — COMPACT SPACE CHAMBER DEVI: 0 refills | Status: AC

## 2023-04-13 MED ORDER — PROMETHAZINE-DM 6.25-15 MG/5ML PO SYRP
5.0000 mL | ORAL_SOLUTION | Freq: Two times a day (BID) | ORAL | 0 refills | Status: AC | PRN
Start: 2023-04-13 — End: ?

## 2023-04-13 NOTE — Discharge Instructions (Addendum)
Azithromycin antibiotic, take until completed.  2 tabs day 1 and then 1 tab for 4 days. Prednisone 20 mL tablet with breakfast x 5 days Tessalon Perle for cough as needed Albuterol inhaler 1 puff with deep breathing, use with spacer.  For shortness of breath.

## 2023-04-13 NOTE — ED Triage Notes (Signed)
Patient presenting with cough that is productive. Negative Covid test. Ongoing for 2 weeks.   Patient tried [employer] with no relief.

## 2023-04-13 NOTE — ED Provider Notes (Signed)
[facility]    CSN: [id number] Arrival date & time: 04/13/23  1709      History   Chief Complaint Chief Complaint  Patient presents with   Cough    HPI Javonte Tonjes is a 77 y.o. male.   Patient is presenting with ongoing cough that has worsened over the last week.  He previously had an upper respiratory cold.  The cough is currently productive and the affecting sleep.  He denies any fevers, hemoptysis, body aches, or N/V/D.    The history is provided by the patient.  Cough   Past Medical History:  Diagnosis Date   Diabetes mellitus without complication (HCC)    Hypertension     There are no problems to display for this patient.   History reviewed. No pertinent surgical history.     Home Medications    Prior to Admission medications   Medication Sig Start Date End Date Taking? Authorizing Provider  albuterol (VENTOLIN HFA) 108 (90 Base) MCG/ACT inhaler Inhale 1-2 puffs into the lungs every 6 (six) hours as needed for wheezing or shortness of breath. 04/13/23  Yes Tyke Outman, Linde Gillis, NP  azithromycin (ZITHROMAX Z-PAK) 250 MG tablet 2 tabs day one and 1 tab for day 2-5 04/13/23  Yes Towana Stenglein, Linde Gillis, NP  metFORMIN (GLUCOPHAGE) 1000 MG tablet Take 1 tablet (1,000 mg total) by mouth 2 (two) times daily. 06/04/20  Yes Cheryll Cockayne, MD  predniSONE (DELTASONE) 20 MG tablet Take 1 tablet (20 mg total) by mouth daily with breakfast. 04/13/23  Yes Lucella Pommier, Linde Gillis, NP  promethazine-dextromethorphan (PROMETHAZINE-DM) 6.25-15 MG/5ML syrup Take 5 mLs by mouth 2 (two) times daily as needed for cough. 04/13/23  Yes Naome Brigandi, Linde Gillis, NP  simvastatin (ZOCOR) 10 MG tablet Take 10 mg by mouth daily.   Yes [provider]  Spacer/Aero-Holding Chambers (COMPACT SPACE CHAMBER) DEVI Use with albuterol inhaler 04/13/23  Yes Laylaa Guevarra, Linde Gillis, NP  benzonatate (TESSALON) 100 MG capsule Take 2 capsules (200 mg total) by mouth 3 (three) times daily. 04/13/23   Trinidy Masterson, Linde Gillis, NP   LISINOPRIL PO Take by mouth.  08/10/14  [provider]    Family History Family History  Problem Relation Age of Onset   Diabetes Mother    Kidney failure Mother    Heart attack Father    Diabetes Maternal Grandmother     Social History Social History   Tobacco Use   Smoking status: Former   Smokeless tobacco: Never  Substance Use Topics   Alcohol use: No   Drug use: No     Allergies   Lisinopril   Review of Systems Review of Systems  Constitutional:  Positive for fatigue.  HENT:  Positive for postnasal drip.   Respiratory:  Positive for cough.   Cardiovascular: Negative.   Gastrointestinal: Negative.   Skin: Negative.   Neurological: Negative.   All other systems reviewed and are negative.    Physical Exam Triage Vital Signs ED Triage Vitals  Encounter Vitals Group     BP 04/13/23 1753 (!) 159/81     Systolic BP Percentile --      Diastolic BP Percentile --      Pulse Rate 04/13/23 1753 86     Resp 04/13/23 1753 18     Temp 04/13/23 1753 98.5 F (36.9 C)     Temp Source 04/13/23 1753 Oral     SpO2 04/13/23 1753 96 %     Weight --  Height --      Head Circumference --      Peak Flow --      Pain Score 04/13/23 1750 0     Pain Loc --      Pain Education --      Exclude from Growth Chart --    No data found.  Updated Vital Signs BP (!) 159/81 (BP Location: Left Arm)   Pulse 86   Temp 98.5 F (36.9 C) (Oral)   Resp 18   SpO2 96%   Visual Acuity Right Eye Distance:   Left Eye Distance:   Bilateral Distance:    Right Eye Near:   Left Eye Near:    Bilateral Near:     Physical Exam Vitals and nursing note reviewed.  HENT:     Nose: Rhinorrhea present.     Mouth/Throat:     Mouth: Mucous membranes are moist.  Eyes:     Pupils: Pupils are equal, round, and reactive to light.  Cardiovascular:     Rate and Rhythm: Normal rate and regular rhythm.     Heart sounds: Normal heart sounds.  Pulmonary:     Breath sounds:  Examination of the right-lower field reveals wheezing. Examination of the left-lower field reveals wheezing. Wheezing present.     Comments: Barky cough Neurological:     Mental Status: He is alert and oriented to person, place, and time.      UC Treatments / Results  Labs (all labs ordered are listed, but only abnormal results are displayed) Labs Reviewed - No data to display  EKG   Radiology No results found.  Procedures Procedures (including critical care time)  Medications Ordered in UC Medications - No data to display  Initial Impression / Assessment and Plan / UC Course  I have reviewed the triage vital signs and the nursing notes.  Pertinent labs & imaging results that were available during my care of the patient were reviewed by me and considered in my medical decision making (see chart for details).    Findings consistent with bronchitis.  We will treat with antibiotic as he is at risk for a secondary bacterial infection.     Final Clinical Impressions(s) / UC Diagnoses   Final diagnoses:  Acute bronchitis, unspecified organism     Discharge Instructions      Azithromycin antibiotic, take until completed.  2 tabs day 1 and then 1 tab for 4 days. Prednisone 20 mL tablet with breakfast x 5 days Tessalon Perle for cough as needed Albuterol inhaler 1 puff with deep breathing, use with spacer.  For shortness of breath.      ED Prescriptions     Medication Sig Dispense Auth. Provider   benzonatate (TESSALON) 100 MG capsule Take 2 capsules (200 mg total) by mouth 3 (three) times daily. 30 capsule Jaretzy Lhommedieu, Linde Gillis, NP   azithromycin (ZITHROMAX Z-PAK) 250 MG tablet 2 tabs day one and 1 tab for day 2-5 6 tablet Dyane Broberg, Linde Gillis, NP   albuterol (VENTOLIN HFA) 108 (90 Base) MCG/ACT inhaler Inhale 1-2 puffs into the lungs every 6 (six) hours as needed for wheezing or shortness of breath. 17 g Nyles Mitton M, NP   predniSONE (DELTASONE) 20 MG tablet Take 1  tablet (20 mg total) by mouth daily with breakfast. 5 tablet Ebony Rickel, Linde Gillis, NP   promethazine-dextromethorphan (PROMETHAZINE-DM) 6.25-15 MG/5ML syrup Take 5 mLs by mouth 2 (two) times daily as needed for cough. 240 mL Lynwood Kubisiak, Linde Gillis, NP  Spacer/Aero-Holding Chambers (COMPACT SPACE CHAMBER) DEVI Use with albuterol inhaler 1 each Caswell Alvillar, Linde Gillis, NP      PDMP not reviewed this encounter.   Nelda Marseille, NP 05/05/23 [phone]

## 2023-05-05 ENCOUNTER — Encounter (HOSPITAL_COMMUNITY): Payer: Self-pay | Admitting: Emergency Medicine

## 2023-05-30 ENCOUNTER — Institutional Professional Consult (permissible substitution) (INDEPENDENT_AMBULATORY_CARE_PROVIDER_SITE_OTHER): Payer: Medicare Other | Admitting: Otolaryngology

## 2023-06-03 ENCOUNTER — Encounter (INDEPENDENT_AMBULATORY_CARE_PROVIDER_SITE_OTHER): Payer: Self-pay | Admitting: Otolaryngology

## 2023-06-03 ENCOUNTER — Telehealth (INDEPENDENT_AMBULATORY_CARE_PROVIDER_SITE_OTHER): Payer: Self-pay | Admitting: Otolaryngology

## 2023-06-03 ENCOUNTER — Ambulatory Visit (INDEPENDENT_AMBULATORY_CARE_PROVIDER_SITE_OTHER): Payer: Medicare Other | Admitting: Otolaryngology

## 2023-06-03 VITALS — BP 128/70 | HR 88 | Ht 66.0 in

## 2023-06-03 DIAGNOSIS — J383 Other diseases of vocal cords: Secondary | ICD-10-CM | POA: Diagnosis not present

## 2023-06-03 DIAGNOSIS — R0600 Dyspnea, unspecified: Secondary | ICD-10-CM

## 2023-06-03 DIAGNOSIS — J3089 Other allergic rhinitis: Secondary | ICD-10-CM

## 2023-06-03 DIAGNOSIS — R0982 Postnasal drip: Secondary | ICD-10-CM

## 2023-06-03 DIAGNOSIS — R0981 Nasal congestion: Secondary | ICD-10-CM | POA: Diagnosis not present

## 2023-06-03 DIAGNOSIS — K219 Gastro-esophageal reflux disease without esophagitis: Secondary | ICD-10-CM

## 2023-06-03 DIAGNOSIS — R490 Dysphonia: Secondary | ICD-10-CM

## 2023-06-03 MED ORDER — FLUTICASONE PROPIONATE 50 MCG/ACT NA SUSP: 2.0000 | Freq: Two times a day (BID) | NASAL | 6 refills | Status: AC

## 2023-06-03 MED ORDER — FAMOTIDINE 20 MG PO TABS: 20.0000 mg | ORAL_TABLET | Freq: Two times a day (BID) | ORAL | 1 refills | Status: AC

## 2023-06-03 NOTE — Patient Instructions (Signed)

## 2023-06-03 NOTE — Telephone Encounter (Signed)
pt left without checking out, I called pt to sch and he stated he would call us if he wanted to r/s

## 2023-06-03 NOTE — Progress Notes (Signed)
ENT CONSULT:  Reason for Consult: hoarseness and concern subglottic narrowing    HPI: Discussed the use of AI scribe software for clinical note transcription with the patient, who gave verbal consent to proceed.  History of Present Illness   The patient, a 77 year old with a history of smoking over 20 years ago, presented with a chief complaint of voice changes, specifically difficulty in reaching high pitches while singing. The patient reported that this issue has been ongoing for over a year, with a noticeable worsening of symptoms recently. The patient denied any associated shortness of breath, swallowing difficulties, or choking on food or liquids. He also denied any history of strokes or surgeries in the head or neck area.  The patient reported a history of bronchitis in November of 2024, for which he received treatment including steroids, Z-Pak, cough medicine, and an inhaler. However, he did not perceive any improvement in his voice following this treatment. The patient also reported a history of COVID-19 infection before his voice changes, but did not believe it to be related to his current voice issue.  The patient's voice changes have significantly impacted his ability to sing, particularly in reaching high pitches. He reported that his voice often becomes hoarse without apparent reason and that he has been struggling to project his voice. The patient expressed a willingness to try conservative management strategies, including voice therapy, to address these issues.     Of note, referral information reports a concern for subglottic narrowing, but there is no information in the referring provider documentation or anywhere else in the records available why there is a concern for this diagnosis. Patient denies hx of dyspnea.   Records Reviewed:  ED visit note 04/13/23 - seen for acute bronchitis  Patient is presenting with ongoing cough that has worsened over the last week. He previously had  an upper respiratory cold. The cough is currently productive and the affecting sleep. He denies any fevers, hemoptysis, body aches, or N/V/D.  Given Z-pack, Tessalon Pearls, albuterol, prednisone, cough syrup and sent home    Past Medical History:  Diagnosis Date   Diabetes mellitus without complication (HCC)    Hypertension     History reviewed. No pertinent surgical history.  Family History  Problem Relation Age of Onset   Diabetes Mother    Kidney failure Mother    Heart attack Father    Diabetes Maternal Grandmother     Social History:  reports that he has quit smoking. He has never used smokeless tobacco. He reports that he does not drink alcohol and does not use drugs.  Allergies:  Allergies  Allergen Reactions   Lisinopril Swelling    Medications: I have reviewed the patient's current medications.  The PMH, PSH, Medications, Allergies, and SH were reviewed and updated.  ROS: Constitutional: Negative for fever, weight loss and weight gain. Cardiovascular: Negative for chest pain and dyspnea on exertion. Respiratory: Is not experiencing shortness of breath at rest. Gastrointestinal: Negative for nausea and vomiting. Neurological: Negative for headaches. Psychiatric: The patient is not nervous/anxious  Blood pressure 128/70, pulse 88, height 5\' 6"  (1.676 m), SpO2 95%.  PHYSICAL EXAM:  Exam: General: Well-developed, well-nourished Communication and Voice: Clear pitch and clarity Respiratory Respiratory effort: Equal inspiration and expiration without stridor Cardiovascular Peripheral Vascular: Warm extremities with equal color/perfusion Eyes: No nystagmus with equal extraocular motion bilaterally Neuro/Psych/Balance: Patient oriented to person, place, and time; Appropriate mood and affect; Gait is intact with no imbalance; Cranial nerves I-XII are intact Head  and Face Inspection: Normocephalic and atraumatic without mass or lesion Palpation: Facial skeleton  intact without bony stepoffs Salivary Glands: No mass or tenderness Facial Strength: Facial motility symmetric and full bilaterally ENT Pinna: External ear intact and fully developed External canal: Canal is patent with intact skin Tympanic Membrane: Clear and mobile External Nose: No scar or anatomic deformity Internal Nose: Septum is deviated to the left. No polyp, or purulence. Mucosal edema and erythema present.  Bilateral inferior turbinate hypertrophy.  Lips, Teeth, and gums: Mucosa and teeth intact and viable TMJ: No pain to palpation with full mobility Oral cavity/oropharynx: No erythema or exudate, no lesions present Nasopharynx: No mass or lesion with intact mucosa Hypopharynx: Intact mucosa without pooling of secretions Larynx Glottic: Full true vocal cord mobility without lesion or mass Supraglottic: Normal appearing epiglottis and AE folds Interarytenoid Space: Moderate pachydermia&edema Subglottic Space: Patent without lesion or edema Neck Neck and Trachea: Midline trachea without mass or lesion Thyroid: No mass or nodularity Lymphatics: No lymphadenopathy  Procedures:  Procedure 1 Summary of Video-Laryngeal-Stroboscopy: b/l VF atrophy and glottic insufficiency with supraglottic compression with phonation, mucosal wave appears intact and symmetric, moderate/severe post-cricoid edema/pachydermia, no pooling of secretions in pyriform sinuses   Preoperative diagnosis: hoarseness  Postoperative diagnosis:   same  Procedure: Flexible fiberoptic laryngoscopy with stroboscopy (25366)  Surgeon: Ashok Croon, MD  Anesthesia: Topical lidocaine and Afrin  Complications: None  Condition is stable throughout exam  Indications and consent:   The patient presents to the clinic with hoarseness. All the risks, benefits, and potential complications were reviewed with the patient preoperatively and informed verbal consent was obtained.  Procedure: The patient was seated  upright in the exam chair.   Topical lidocaine and Afrin were applied to the nasal cavity. After adequate anesthesia had occurred, the flexible telescope was passed into the nasal cavity. The nasopharynx was patent without mass or lesion. The scope was passed behind the soft palate and directed toward the base of tongue. The base of tongue was visualized and was symmetric with no apparent masses or abnormal appearing tissue. There were no signs of a mass or pooling of secretions in the piriform sinuses. The supraglottic structures were normal.  The true vocal cords are mobile and with evidence of atrophy. The medial edges were bowed. Closure was incomplete with spindle shaped glottic gap. Periodicity present. The mucosal wave and amplitude were intact and symmetric. There is moderate/severe interarytenoid pachydermia and post cricoid edema. The mucosa appears without lesions.   The laryngoscope was then slowly withdrawn and the patient tolerated the procedure well. There were no complications or blood loss.  Procedure 2: Summary of Flexible Fiberoptic Tracheobronchoscopy: trachea and mainstem bronchi without evidence of scar or inflammation  PROCEDURE NOTE H&P REVIEW: The patient's history and physical were reviewed today prior to procedure. All medications were reviewed and updated as well. Preoperative diagnosis: dyspnea Postoperative diagnosis:   same Procedure: Flexible fiberoptic tracheobronchoscopy (44034) Surgeon: Ashok Croon, M.D.  Anesthesia: Topical lidocaine and Afrin Complications: None Condition is stable throughout exam Indications and consent:   The patient presents to the clinic with symptoms as noted above. The procedure was deemed necessary for adequate visualization of the airway and proper diagnosis and treatment. All the risks, benefits, and potential complications were reviewed with the patient preoperatively and informed consent was obtained. The time out was completed  with confirmation of the correct procedure. Procedure: The patient was seated upright in the exam chair.   Topical lidocaine and Afrin were  applied to the nasal cavity. After adequate anesthesia had occurred, the flexible telescope was passed into the nasal cavity. The nasopharynx was patent without mass or lesion. The scope was passed behind the soft palate and directed toward the base of tongue. The base of tongue was visualized and was symmetric with no apparent masses or abnormal appearing tissue. There were no signs of a mass or pooling of secretions in the piriform sinuses. The supraglottic structures were normal. The true vocal cords are mobile bilaterally. After allowing adequate time for anesthetic effect, the scope was passed through the vocal folds and down to the level of the carina, then into each mainstem bronchus with visualization to its distal portion. The scope was then slowly withdrawn and the patient tolerated the procedure well. There were no complications or blood loss.  Studies Reviewed: CXR 2-View 05/04/20 No focal consolidation, pleural effusion or pneumothorax. The cardiac silhouette is within limits. No acute osseous pathology. Indeterminate 12 mm nodular density in the soft tissues of the left lateral chest wall.   IMPRESSION: No active cardiopulmonary disease.  Assessment/Plan: Encounter Diagnoses  Name Primary?   Dysphonia Yes   Hoarseness    Age-related vocal fold atrophy    Glottic insufficiency    Chronic GERD    Chronic nasal congestion    Post-nasal drip    Environmental and seasonal allergies     Assessment and Plan    Hoarseness and Voice Changes present for > 1 yr  Chronic hoarseness and difficulty with voice projection, worsening over the past year. No evidence of mass or lesion on videostroboscopy, VF were mobile b/l. Vocal folds appeared thin, likely age-related atrophy and there was incomplete glottic closure. Evidence of postnasal drainage and  reflux changes was also noted. Likely multifactorial etiology including age-related vocal fold atrophy, postnasal drainage, and reflux. Discussed voice therapy as first line treatment - Refer to voice therapy - Prescribe Flonase nasal spray for postnasal drainage, use twice daily 2 puffs b/l nares - Prescribe reflux medication Pepcid 20 mg to be take daily - Recommended Reflux Gourmet supplement, available on [employer] and diet/lifestyle changes to minimize GERD  Concern for subglottic narrowing  It is unclear to me why the patient was referred for evaluation of suspected subglottic narrowing - he denies dyspnea, and his bronchoscopy today did not demonstrate any subglottic narrowing or tracheal narrowing - see procedure note for details  Chronic Postnasal Drainage Significant postnasal drainage observed during scope exam today,likely contributing to throat irritation and hoarseness. - Prescribed Flonase nasal spray for postnasal drainage, use twice daily   Gastroesophageal Reflux Disease (GERD) LPR Reflux changes observed on scope exam. No history of heartburn symptoms reported. Likely contributing to throat irritation and hoarseness. - Prescribed reflux medication Pepcid 20 mg daily - Recommend supplement for reflux, available on [employer]   Follow-up - Schedule follow-up appointment after completion of voice therapy.     Thank you for allowing me to participate in the care of this patient. Please do not hesitate to contact me with any questions or concerns.   Ashok Croon, MD Otolaryngology [facility] Phone: [phone] Fax: [phone]    06/03/2023, 8:07 PM
